# Patient Record
Sex: Male | Born: 1970 | Race: White | Hispanic: No | Marital: Married | Smoking: Never smoker
Health system: Southern US, Community
[De-identification: ages and names within clinical notes are randomized; demographics above are authoritative.]

## PROBLEM LIST (undated history)

## (undated) DIAGNOSIS — I2584 Coronary atherosclerosis due to calcified coronary lesion: Secondary | ICD-10-CM

## (undated) DIAGNOSIS — I2510 Atherosclerotic heart disease of native coronary artery without angina pectoris: Secondary | ICD-10-CM

## (undated) DIAGNOSIS — R911 Solitary pulmonary nodule: Secondary | ICD-10-CM

## (undated) DIAGNOSIS — I10 Essential (primary) hypertension: Secondary | ICD-10-CM

## (undated) DIAGNOSIS — E785 Hyperlipidemia, unspecified: Secondary | ICD-10-CM

## (undated) HISTORY — DX: Essential (primary) hypertension: I10

## (undated) HISTORY — DX: Atherosclerotic heart disease of native coronary artery without angina pectoris: I25.10

## (undated) HISTORY — DX: Solitary pulmonary nodule: R91.1

## (undated) HISTORY — DX: Hyperlipidemia, unspecified: E78.5

## (undated) HISTORY — DX: Coronary atherosclerosis due to calcified coronary lesion: I25.84

---

## 2003-11-13 HISTORY — PX: COLONOSCOPY: SHX174

## 2007-11-02 ENCOUNTER — Emergency Department (HOSPITAL_COMMUNITY): Admission: EM | Admit: 2007-11-02 | Discharge: 2007-11-02 | Payer: Self-pay | Admitting: Emergency Medicine

## 2017-12-13 ENCOUNTER — Encounter (HOSPITAL_BASED_OUTPATIENT_CLINIC_OR_DEPARTMENT_OTHER): Payer: Self-pay | Admitting: *Deleted

## 2017-12-13 ENCOUNTER — Emergency Department (HOSPITAL_BASED_OUTPATIENT_CLINIC_OR_DEPARTMENT_OTHER): Payer: 59

## 2017-12-13 ENCOUNTER — Emergency Department (HOSPITAL_BASED_OUTPATIENT_CLINIC_OR_DEPARTMENT_OTHER)
Admission: EM | Admit: 2017-12-13 | Discharge: 2017-12-13 | Disposition: A | Discharge status: Home / Self Care | Payer: 59 | Attending: Emergency Medicine | Admitting: Emergency Medicine

## 2017-12-13 DIAGNOSIS — R072 Precordial pain: Secondary | ICD-10-CM | POA: Diagnosis not present

## 2017-12-13 DIAGNOSIS — R079 Chest pain, unspecified: Secondary | ICD-10-CM | POA: Diagnosis present

## 2017-12-13 DIAGNOSIS — I10 Essential (primary) hypertension: Secondary | ICD-10-CM

## 2017-12-13 LAB — CBC WITH DIFFERENTIAL/PLATELET
Basophils Absolute: 0 10*3/uL (ref 0.0–0.1)
Basophils Relative: 0 %
EOS ABS: 0.2 10*3/uL (ref 0.0–0.7)
EOS PCT: 3 %
HCT: 49.9 % (ref 39.0–52.0)
Hemoglobin: 17.3 g/dL — ABNORMAL HIGH (ref 13.0–17.0)
LYMPHS ABS: 2 10*3/uL (ref 0.7–4.0)
Lymphocytes Relative: 29 %
MCH: 28.5 pg (ref 26.0–34.0)
MCHC: 34.7 g/dL (ref 30.0–36.0)
MCV: 82.1 fL (ref 78.0–100.0)
MONO ABS: 0.7 10*3/uL (ref 0.1–1.0)
Monocytes Relative: 10 %
Neutro Abs: 3.9 10*3/uL (ref 1.7–7.7)
Neutrophils Relative %: 58 %
PLATELETS: 199 10*3/uL (ref 150–400)
RBC: 6.08 MIL/uL — AB (ref 4.22–5.81)
RDW: 12.9 % (ref 11.5–15.5)
WBC: 6.8 10*3/uL (ref 4.0–10.5)

## 2017-12-13 LAB — BASIC METABOLIC PANEL
ANION GAP: 9 (ref 5–15)
BUN: 17 mg/dL (ref 6–20)
CALCIUM: 9.4 mg/dL (ref 8.9–10.3)
CO2: 26 mmol/L (ref 22–32)
Chloride: 104 mmol/L (ref 101–111)
Creatinine, Ser: 1.16 mg/dL (ref 0.61–1.24)
GFR calc Af Amer: >60 mL/min (ref >60)
GFR calc non Af Amer: >60 mL/min (ref >60)
GLUCOSE: 112 mg/dL — AB (ref 65–99)
Potassium: 3.9 mmol/L (ref 3.5–5.1)
SODIUM: 139 mmol/L (ref 135–145)

## 2017-12-13 LAB — TROPONIN I
Troponin I: <0.03 ng/mL (ref <0.03)
Troponin I: <0.03 ng/mL (ref <0.03)

## 2017-12-13 MED ORDER — KETOROLAC TROMETHAMINE 15 MG/ML IJ SOLN
15.0000 mg | Freq: Once | INTRAMUSCULAR | Status: AC
  Administered 2017-12-13: 15 mg via INTRAVENOUS
  Filled 2017-12-13: qty 1

## 2017-12-13 MED ORDER — HYDROCHLOROTHIAZIDE 25 MG PO TABS: 25.0000 mg | ORAL_TABLET | Freq: Every day | ORAL | 0 refills | Status: DC

## 2017-12-13 MED ORDER — METHOCARBAMOL 500 MG PO TABS
1000.0000 mg | ORAL_TABLET | Freq: Once | ORAL | Status: AC
  Administered 2017-12-13: 1000 mg via ORAL
  Filled 2017-12-13: qty 2

## 2017-12-13 MED ORDER — GI COCKTAIL ~~LOC~~
ORAL | Status: AC
  Filled 2017-12-13: qty 30

## 2017-12-13 MED ORDER — OMEPRAZOLE 20 MG PO CPDR: 20.0000 mg | DELAYED_RELEASE_CAPSULE | Freq: Every day | ORAL | 0 refills | Status: DC

## 2017-12-13 MED ORDER — IOPAMIDOL (ISOVUE-370) INJECTION 76%
100.0000 mL | Freq: Once | INTRAVENOUS | Status: AC | PRN
  Administered 2017-12-13: 100 mL via INTRAVENOUS

## 2017-12-13 MED ORDER — GI COCKTAIL ~~LOC~~
30.0000 mL | Freq: Once | ORAL | Status: AC
  Administered 2017-12-13: 30 mL via ORAL

## 2017-12-13 NOTE — ED Provider Notes (Addendum)
[facility] EMERGENCY DEPARTMENT Provider Note   CSN: [id number] Arrival date & time: 12/13/17  4098     History   Chief Complaint No chief complaint on file.   HPI Kenneth Combs is a 47 y.o. male.  The history is provided by the patient.  Chest Pain   This is a new problem. The current episode started 6 to 12 hours ago. The problem occurs constantly. The problem has not changed since onset.The pain is associated with rest and eating (Ate [employer] less than an hour prior to symptoms.  Leg pain has been off and on). The pain is present in the lateral region. The pain is severe. The quality of the pain is described as dull. The pain does not radiate. Pertinent negatives include no abdominal pain, no back pain, no diaphoresis, no dizziness, no exertional chest pressure, no near-syncope, no palpitations, no shortness of breath and no sputum production. Risk factors include male gender and obesity.  Pertinent negatives for past medical history include no Marfan's syndrome and no valve disorder.  Pertinent negatives for family medical history include: no Marfan's syndrome.  Procedure history is negative for cardiac catheterization.    No past medical history on file.  There are no active problems to display for this patient.    Home Medications    Prior to Admission medications   Not on File    Family History No family history on file.  Social History Social History   Tobacco Use  . Smoking status: Not on file  Substance Use Topics  . Alcohol use: Not on file  . Drug use: Not on file     Allergies   Patient has no allergy information on record.   Review of Systems Review of Systems  Constitutional: Negative for diaphoresis.  Respiratory: Negative for sputum production and shortness of breath.   Cardiovascular: Positive for chest pain. Negative for palpitations, leg swelling and near-syncope.  Gastrointestinal: Negative for abdominal pain.    Musculoskeletal: Positive for arthralgias. Negative for back pain.  Neurological: Negative for dizziness.  All other systems reviewed and are negative.    Physical Exam Updated Vital Signs There were no vitals taken for this visit.  Physical Exam  Constitutional: He is oriented to person, place, and time. He appears well-developed and well-nourished. No distress.  HENT:  Head: Normocephalic and atraumatic.  Nose: Nose normal.  Mouth/Throat: No oropharyngeal exudate.  Eyes: Conjunctivae are normal. Pupils are equal, round, and reactive to light.  Neck: Normal range of motion. Neck supple.  Cardiovascular: Normal rate, regular rhythm, normal heart sounds and intact distal pulses.  Pulmonary/Chest: Effort normal and breath sounds normal. No stridor. No respiratory distress. He has no wheezes. He has no rales.  Abdominal: Soft. Bowel sounds are normal. He exhibits no mass. There is no tenderness. There is no rebound and no guarding.  Musculoskeletal: Normal range of motion. He exhibits no tenderness.  Neurological: He is alert and oriented to person, place, and time.  Skin: Skin is warm and dry. Capillary refill takes less than 2 seconds.  Psychiatric: He has a normal mood and affect.     ED Treatments / Results  Labs (all labs ordered are listed, but only abnormal results are displayed)  Results for orders placed or performed during the hospital encounter of 12/13/17  CBC with Differential/Platelet  Result Value Ref Range   WBC 6.8 4.0 - 10.5 K/uL   RBC 6.08 (H) 4.22 - 5.81 MIL/uL   Hemoglobin  17.3 (H) 13.0 - 17.0 g/dL   HCT 16.1 09.6 - 04.5 %   MCV 82.1 78.0 - 100.0 fL   MCH 28.5 26.0 - 34.0 pg   MCHC 34.7 30.0 - 36.0 g/dL   RDW 40.9 81.1 - 91.4 %   Platelets 199 150 - 400 K/uL   Neutrophils Relative % 58 %   Lymphocytes Relative 29 %   Monocytes Relative 10 %   Eosinophils Relative 3 %   Basophils Relative 0 %   Neutro Abs 3.9 1.7 - 7.7 K/uL   Lymphs Abs 2.0 0.7 -  4.0 K/uL   Monocytes Absolute 0.7 0.1 - 1.0 K/uL   Eosinophils Absolute 0.2 0.0 - 0.7 K/uL   Basophils Absolute 0.0 0.0 - 0.1 K/uL   WBC Morphology ATYPICAL LYMPHOCYTES   Basic metabolic panel  Result Value Ref Range   Sodium 139 135 - 145 mmol/L   Potassium 3.9 3.5 - 5.1 mmol/L   Chloride 104 101 - 111 mmol/L   CO2 26 22 - 32 mmol/L   Glucose, Bld 112 (H) 65 - 99 mg/dL   BUN 17 6 - 20 mg/dL   Creatinine, Ser 7.82 0.61 - 1.24 mg/dL   Calcium 9.4 8.9 - 95.6 mg/dL   GFR calc non Af Amer >60 >60 mL/min   GFR calc Af Amer >60 >60 mL/min   Anion gap 9 5 - 15  Troponin I  Result Value Ref Range   Troponin I <0.03 <0.03 ng/mL   Dg Chest 2 View  Result Date: 12/13/2017 CLINICAL DATA:  Left chest pain for 6 hours. Leg swelling for 1 day. Nonsmoker EXAM: CHEST  2 VIEW COMPARISON:  None. FINDINGS: The heart size and mediastinal contours are within normal limits. Both lungs are clear. The visualized skeletal structures are unremarkable. IMPRESSION: No active cardiopulmonary disease. Electronically Signed   By: Burman Nieves M.D.   On: 12/13/2017 04:07   Ct Angio Chest Pe W And/or Wo Contrast  Result Date: 12/13/2017 CLINICAL DATA:  Left chest pain tonight. Right posterior thigh pain. Nonsmoker. EXAM: CT ANGIOGRAPHY CHEST WITH CONTRAST TECHNIQUE: Multidetector CT imaging of the chest was performed using the standard protocol during bolus administration of intravenous contrast. Multiplanar CT image reconstructions and MIPs were obtained to evaluate the vascular anatomy. CONTRAST:  ISOVUE-370 IOPAMIDOL (ISOVUE-370) INJECTION 76% COMPARISON:  Chest radiograph 12/13/2017 FINDINGS: Cardiovascular: Good opacification of the central and segmental pulmonary arteries. No focal filling defects. No evidence of significant pulmonary embolus. Normal caliber thoracic aorta. Normal heart size. No pericardial effusion. Few scattered coronary artery calcifications. Mediastinum/Nodes: No enlarged mediastinal,  hilar, or axillary lymph nodes. Thyroid gland, trachea, and esophagus demonstrate no significant findings. Lungs/Pleura: Mild dependent changes in the lung bases. No consolidation or airspace disease. 7 mm nodule at the superior segment of the right lower lobe. No pleural effusions. No pneumothorax. Airways are patent. Upper Abdomen: 8.6 cm cyst in the upper pole right kidney. No acute process demonstrated in the upper abdomen. Musculoskeletal: No chest wall abnormality. No acute or significant osseous findings. Review of the MIP images confirms the above findings. IMPRESSION: 1. No evidence of significant pulmonary embolus. 2. Few scattered coronary artery calcifications. 3. 7 mm nodule in the superior segment right lower lobe. Non-contrast chest CT at 6-12 months is recommended. If the nodule is stable at time of repeat CT, then future CT at 18-24 months (from today's scan) is considered optional for low-risk patients, but is recommended for high-risk patients. This recommendation follows  the consensus statement: Guidelines for Management of Incidental Pulmonary Nodules Detected on CT Images: From the Fleischner Society 2017; Radiology 2017; 284:228-243. 4. Benign-appearing cyst in the upper pole right kidney. Electronically Signed   By: Burman NievesWilliam  Stevens M.D.   On: 12/13/2017 04:49    EKG  EKG Interpretation  Date/Time:  Friday December 13 2017 03:34:02 EST Ventricular Rate:  93 PR Interval:    QRS Duration: 105 QT Interval:  343 QTC Calculation: 427 R Axis:   60 Text Interpretation:  Sinus rhythm Confirmed by Nicanor AlconPalumbo, Christerpher Clos ([id number]) on 12/13/2017 3:40:52 AM        Procedures Procedures (including critical care time)  Medications Ordered in ED  Medications  ketorolac (TORADOL) 15 MG/ML injection 15 mg (15 mg Intravenous Given 12/13/17 0407)  gi cocktail (Maalox,Lidocaine,Donnatal) (30 mLs Oral Given 12/13/17 0423)  iopamidol (ISOVUE-370) 76 % injection 100 mL (100 mLs Intravenous Contrast Given  12/13/17 0431)  methocarbamol (ROBAXIN) tablet 1,000 mg (1,000 mg Oral Given 12/13/17 0605)   Second troponin did not cross over into EMR but EDP reviewed paper copy from labs and it was 0.0.  Patient has ruled out for MI in the ED  HEART score 1, very low risk for MACE.  Ruled out in the ED  Final Clinical Impressions(s) / ED Diagnoses   Follow up with cardiology for outpatient stress test and PMD, informed of need for follow up chest CT in 6-12 months to assess stability of pulmonary nodule seen on CT, this was printed on your discharge paperwork.  Symptons are consistent with GERD.  Will start PMD and advise gerd friendly diet.  Will also start HCTZ.  Return for weakness, numbness, changes in vision or speech,  fevers > 100.4 unrelieved by medication, shortness of breath, intractable vomiting, or diarrhea, abdominal pain, Inability to tolerate liquids or food, cough, altered mental status or any concerns. No signs of systemic illness or infection. The patient is nontoxic-appearing on exam and vital signs are within normal limits.    I have reviewed the triage vital signs and the nursing notes. Pertinent labs &imaging results that were available during my care of the patient were reviewed by me and considered in my medical decision making (see chart for details).  After history, exam, and medical workup I feel the patient has been appropriately medically screened and is safe for discharge home. Pertinent diagnoses were discussed with the patient. Patient was given return precautions.     Rickiya Picariello, MD 12/13/17 [id number]    Cy BlamerPalumbo, Trashaun Streight, MD 12/13/17 [id number]    Cy BlamerPalumbo, Lavel Rieman, MD 12/13/17 [id number]

## 2017-12-13 NOTE — ED Triage Notes (Signed)
C/o left lower chest pain onset 2100 last pm  Denies n/v no sob,  Also rt thigh pain

## 2017-12-13 NOTE — Discharge Instructions (Addendum)
7 mm nodule in the superior segment right lower lobe. Non-contrast chest CT at 6-12 months is recommended. If the nodule is stable at time of repeat CT, then future CT at 18-24 months (from today's scan) is considered optional for low-risk patients, but is recommended for high-risk patients. This recommendation follows the consensus statement

## 2017-12-17 DIAGNOSIS — R911 Solitary pulmonary nodule: Secondary | ICD-10-CM | POA: Insufficient documentation

## 2017-12-17 DIAGNOSIS — I10 Essential (primary) hypertension: Secondary | ICD-10-CM | POA: Insufficient documentation

## 2018-01-24 ENCOUNTER — Encounter: Payer: Self-pay | Admitting: Cardiology

## 2018-02-04 ENCOUNTER — Ambulatory Visit: Payer: 59 | Admitting: Cardiology

## 2018-02-04 ENCOUNTER — Encounter: Payer: Self-pay | Admitting: Cardiology

## 2018-02-04 VITALS — BP 138/90 | HR 78 | Ht 75.0 in | Wt 287.8 lb

## 2018-02-04 DIAGNOSIS — I10 Essential (primary) hypertension: Secondary | ICD-10-CM

## 2018-02-04 DIAGNOSIS — I2510 Atherosclerotic heart disease of native coronary artery without angina pectoris: Secondary | ICD-10-CM | POA: Diagnosis not present

## 2018-02-04 DIAGNOSIS — I2584 Coronary atherosclerosis due to calcified coronary lesion: Secondary | ICD-10-CM

## 2018-02-04 DIAGNOSIS — R079 Chest pain, unspecified: Secondary | ICD-10-CM | POA: Insufficient documentation

## 2018-02-04 HISTORY — DX: Atherosclerotic heart disease of native coronary artery without angina pectoris: I25.10

## 2018-02-04 NOTE — Progress Notes (Signed)
Cardiology Office Note    Date:  02/04/2018   ID:  Kenneth GulaEric Tufte, DOB 05/23/1971, MRN [id number]  PCP:  Patient, No Pcp Per  Cardiologist:  Armanda Magicraci Turner, MD   Chief Complaint  Patient presents with  . New Patient (Initial Visit)    chest pain    History of Present Illness:  Kenneth Combs Akter is a 47 y.o. male who is being seen today for the evaluation of chest pain  at the request of Dr. Nicanor AlconPalumbo.  This is a 47yo male with a history of HTN who recently was seen in the ER a month ago for chest pain.  He says that he was trying to go to sleep and started having pain in his left chest which he initially thought was gas.  He then started getting leg pain in the right leg from the knee back to the thigh and he became concerned and went to the ER.  He denied any SOB, nausea or diaphoresis with the discomfort.  In the ER he was given an antacid and workup was normal including trop x 2.  He says that the leg and CP were gone by the time the ER MD saw him and he has not had any further episodes.  He denies any DOE, PND, orthopnea, LE edema, dizziness, palpitations or syncope.  He has never smoked.  He has no family history of heart disease. Chest CT angio showed minimal coronary artery calcifications.      Past Medical History:  Diagnosis Date  . Coronary artery calcification 02/04/2018  . Hypertension     No past surgical history on file.  Current Medications: Current Meds  Medication Sig  . hydrochlorothiazide (HYDRODIURIL) 25 MG tablet Take 1 tablet by mouth daily.  . meloxicam (MOBIC) 15 MG tablet Take 1 tablet by mouth daily.    Allergies:   Patient has no known allergies.   Social History   Socioeconomic History  . Marital status: Married    Spouse name: Not on file  . Number of children: Not on file  . Years of education: Not on file  . Highest education level: Not on file  Occupational History  . Not on file  Social Needs  . Financial resource strain: Not on file  . Food  insecurity:    Worry: Not on file    Inability: Not on file  . Transportation needs:    Medical: Not on file    Non-medical: Not on file  Tobacco Use  . Smoking status: Never Smoker  . Smokeless tobacco: Never Used  Substance and Sexual Activity  . Alcohol use: No    Frequency: Never  . Drug use: No  . Sexual activity: Not on file  Lifestyle  . Physical activity:    Days per week: Not on file    Minutes per session: Not on file  . Stress: Not on file  Relationships  . Social connections:    Talks on phone: Not on file    Gets together: Not on file    Attends religious service: Not on file    Active member of club or organization: Not on file    Attends meetings of clubs or organizations: Not on file    Relationship status: Not on file  Other Topics Concern  . Not on file  Social History Narrative  . Not on file     Family History:  The patient's  family history includes Cancer in his father and mother.  ROS:   Please see the history of present illness.    ROS All other systems reviewed and are negative.  No flowsheet data found.     PHYSICAL EXAM:   VS:  BP 138/90   Pulse 78   Ht 6\' 3"  (1.905 m)   Wt 287 lb 12.8 oz (130.5 kg)   SpO2 98%   BMI 35.97 kg/m    GEN: Well nourished, well developed, in no acute distress  HEENT: normal  Neck: no JVD, carotid bruits, or masses Cardiac: RRR; no murmurs, rubs, or gallops,no edema.  Intact distal pulses bilaterally.  Respiratory:  clear to auscultation bilaterally, normal work of breathing GI: soft, nontender, nondistended, + BS MS: no deformity or atrophy  Skin: warm and dry, no rash Neuro:  Alert and Oriented x 3, Strength and sensation are intact Psych: euthymic mood, full affect  Wt Readings from Last 3 Encounters:  02/04/18 287 lb 12.8 oz (130.5 kg)  12/13/17 285 lb (129.3 kg)      Studies/Labs Reviewed:   EKG:  EKG is not ordered today.   Recent Labs: 12/13/2017: BUN 17; Creatinine, Ser 1.16;  Hemoglobin 17.3; Platelets 199; Potassium 3.9; Sodium 139   Lipid Panel No results found for: CHOL, TRIG, HDL, CHOLHDL, VLDL, LDLCALC, LDLDIRECT  Additional studies/ records that were reviewed today include:  Chest CT angio and ER notes    ASSESSMENT:    1. Chest pain, unspecified type   2. Essential hypertension   3. Coronary artery calcification      PLAN:  In order of problems listed above:  1. Chest pain - his sx are atypical but he does have minimal coronary artery calcifications on chest CT.  His EKG is nonischemic, he does not smoke and he has no family history of CAD.  His only CRF is HTN.  I have recommended an ETT to rule out ischemia.    2.  HTN - Bp is well controlled one exam today.  He will continue on HCTZ 25mg  daily.   3.  Coronary artery calcifications - minimal on recent chest CT - I will get a coronary calcium score for risk stratification.    Medication Adjustments/Labs and Tests Ordered: Current medicines are reviewed at length with the patient today.  Concerns regarding medicines are outlined above.  Medication changes, Labs and Tests ordered today are listed in the Patient Instructions below.  There are no Patient Instructions on file for this visit.   Signed, Armanda Magic, MD  02/04/2018 1:55 PM    [facility] [address] [place], [place], [place]  [address] Phone: [phone]; Fax: [phone]

## 2018-02-04 NOTE — Patient Instructions (Addendum)
Medication Instructions:  Your physician recommends that you continue on your current medications as directed. Please refer to the Current Medication list given to you today.  If you need a refill on your cardiac medications, please contact your pharmacy first.  Labwork: None ordered   Testing/Procedures: Your physician has requested that you have an exercise tolerance test. For further information please visit [email or web address] Please also follow instruction sheet, as given.  Your physician has requested that you have cardiac CT. Cardiac computed tomography (CT) is a painless test that uses an x-ray machine to take clear, detailed pictures of your heart. For further information please visit [email or web address] Please follow instruction sheet as given.   Follow-Up: Your physician wants you to follow-up as needed with Dr. Mayford Knifeurner.   Any Other Special Instructions Will Be Listed Below (If Applicable).   [facility] you [facility] choosing [facility]    Lyda PeroneRena Clancey Welton, RN  [phone]  If you need a refill on your cardiac medications before your next appointment, please call your pharmacy.

## 2018-03-14 ENCOUNTER — Encounter (INDEPENDENT_AMBULATORY_CARE_PROVIDER_SITE_OTHER): Payer: Self-pay

## 2018-03-14 ENCOUNTER — Ambulatory Visit (INDEPENDENT_AMBULATORY_CARE_PROVIDER_SITE_OTHER)
Admission: RE | Admit: 2018-03-14 | Discharge: 2018-03-14 | Disposition: A | Discharge status: Home / Self Care | Payer: Self-pay | Source: Ambulatory Visit | Attending: Cardiology | Admitting: Cardiology

## 2018-03-14 ENCOUNTER — Ambulatory Visit (INDEPENDENT_AMBULATORY_CARE_PROVIDER_SITE_OTHER): Payer: 59

## 2018-03-14 DIAGNOSIS — I2510 Atherosclerotic heart disease of native coronary artery without angina pectoris: Secondary | ICD-10-CM | POA: Diagnosis not present

## 2018-03-14 DIAGNOSIS — I2584 Coronary atherosclerosis due to calcified coronary lesion: Secondary | ICD-10-CM

## 2018-03-14 DIAGNOSIS — R079 Chest pain, unspecified: Secondary | ICD-10-CM | POA: Diagnosis not present

## 2018-03-14 LAB — EXERCISE TOLERANCE TEST
CHL CUP MPHR: 174 {beats}/min
CHL RATE OF PERCEIVED EXERTION: 19
CSEPED: 9 min
Estimated workload: 11.7 METS
Exercise duration (sec): 59 s
Peak HR: 190 {beats}/min
Percent HR: 109 %
Rest HR: 76 {beats}/min

## 2018-03-19 ENCOUNTER — Telehealth: Payer: Self-pay

## 2018-03-19 DIAGNOSIS — E785 Hyperlipidemia, unspecified: Secondary | ICD-10-CM

## 2018-03-19 DIAGNOSIS — R931 Abnormal findings on diagnostic imaging of heart and coronary circulation: Secondary | ICD-10-CM

## 2018-03-19 MED ORDER — ASPIRIN EC 81 MG PO TBEC: 81.0000 mg | DELAYED_RELEASE_TABLET | Freq: Every day | ORAL | 3 refills | Status: AC

## 2018-03-19 MED ORDER — ATORVASTATIN CALCIUM 20 MG PO TABS: 20.0000 mg | ORAL_TABLET | Freq: Every day | ORAL | 11 refills | Status: DC

## 2018-03-19 NOTE — Telephone Encounter (Signed)
Patient instructed to start Lipitor 20 mg daily and repeat labs on 05/19/18. He verbalized understanding and thankful for the call

## 2018-03-19 NOTE — Telephone Encounter (Signed)
Patient had a lipid panel drawn at his primary MD at [facility] on 01/2018, labs are visible under care everywhere. Total Cholesterol 174 TG 93 HDL 35 LDL 120  ALT 30  Notes recorded by Phineas Semen, RN on 03/19/2018 at 3:34 PM EDT Patient made aware of cardiac scoring results. He was informed of Dr. Norris Cross recommendation to start ASA 81 mg daily and lipid panel. Patient in agreement with treatment plan and thankful for the call. Patient schedule for NMR lipid panel on 03/24/18.   Notes recorded by Quintella Reichert, MD on 03/17/2018 at 5:32 PM EDT Increased calcium score at 158 which is 96% for age and sex matched controls - needs aggressive risk factor modification. ETT with no ischemia. Please get an FLP with NMR panel and HbA1C. Followup with me in 1 year. Start ASA  daily

## 2018-03-19 NOTE — Addendum Note (Signed)
Addended by: Phineas Semen on: 03/19/2018 04:23 PM   Modules accepted: Orders

## 2018-03-19 NOTE — Telephone Encounter (Signed)
Start Lipitor  daily and repeat FLP and ALT in 6 weeks

## 2018-03-24 ENCOUNTER — Other Ambulatory Visit: Payer: 59 | Admitting: *Deleted

## 2018-03-24 DIAGNOSIS — R931 Abnormal findings on diagnostic imaging of heart and coronary circulation: Secondary | ICD-10-CM

## 2018-03-25 LAB — NMR, LIPOPROFILE
CHOLESTEROL, TOTAL: 177 mg/dL (ref 100–199)
HDL PARTICLE NUMBER: 29.9 umol/L — AB (ref >=30.5)
HDL-C: 43 mg/dL (ref >39)
LDL Particle Number: 1523 nmol/L — ABNORMAL HIGH (ref <1000)
LDL Size: 20.7 nm (ref >20.5)
LDL-C: 108 mg/dL — ABNORMAL HIGH (ref 0–99)
LP-IR Score: 83 — ABNORMAL HIGH (ref <=45)
SMALL LDL PARTICLE NUMBER: 855 nmol/L — AB (ref <=527)
Triglycerides: 132 mg/dL (ref 0–149)

## 2018-03-25 LAB — LIPOPROTEIN A (LPA): Lipoprotein (a): 4 nmol/L (ref <75)

## 2018-03-25 LAB — APOLIPOPROTEIN B: APOLIPOPROTEIN B: 94 mg/dL — AB (ref <90)

## 2018-04-01 ENCOUNTER — Telehealth: Payer: Self-pay

## 2018-04-01 DIAGNOSIS — E785 Hyperlipidemia, unspecified: Secondary | ICD-10-CM

## 2018-04-01 MED ORDER — ATORVASTATIN CALCIUM 80 MG PO TABS: 80.0000 mg | ORAL_TABLET | Freq: Every day | ORAL | 3 refills | Status: DC

## 2018-04-01 NOTE — Telephone Encounter (Signed)
Notes recorded by Phineas Semen, RN on 04/01/2018 at 2:28 PM EDT Patient made aware of lipid results and Dr. Norris Cross recommendation to increase Lipitor to 80 mg daily and repeat FLP and ALT in 8 weeks. Patient stated he would call back to schedule lab appt. Patient verbalized understanding and thankful for the call ------  Notes recorded by Quintella Reichert, MD on 04/01/2018 at 7:52 AM EDT Increase atorvastatin to  daily and repeat FLP and ALT in 8 weeks

## 2018-04-02 NOTE — Telephone Encounter (Signed)
Patient scheduled for repeat FLP and ALT on 05/19/18.

## 2018-05-19 ENCOUNTER — Other Ambulatory Visit: Payer: 59

## 2018-11-18 IMAGING — CT CT HEART SCORING
2 series · 14 of 20 positions shown, 16 images · non-contrast
Comparison: 12/13/2017

CLINICAL DATA: Risk stratification

EXAM:
Coronary Calcium Score
TECHNIQUE: The patient was scanned on a Siemens Force scanner. Axial
non-contrast 3 mm slices were carried out through the heart. The
data set was analyzed on a dedicated work station and scored using
the Agatson method.

[Series 3: casc 3.0 i36f 2 bestdiast 66 % · axial · 0.32mm/px · z∈[-200,-113]mm · 6 of 41 slices shown, 8 images]
[im 6/41  vessel]
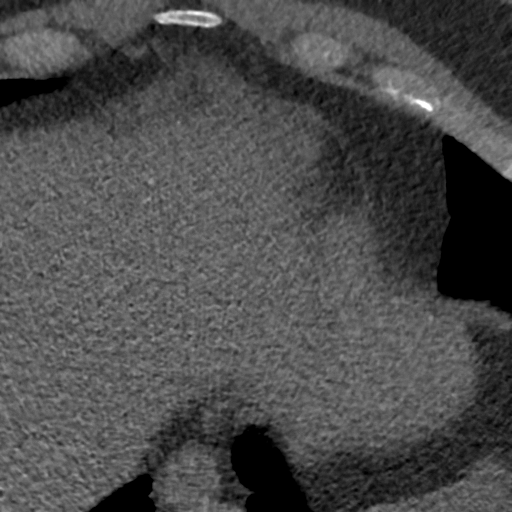
[im 6/41  lung]
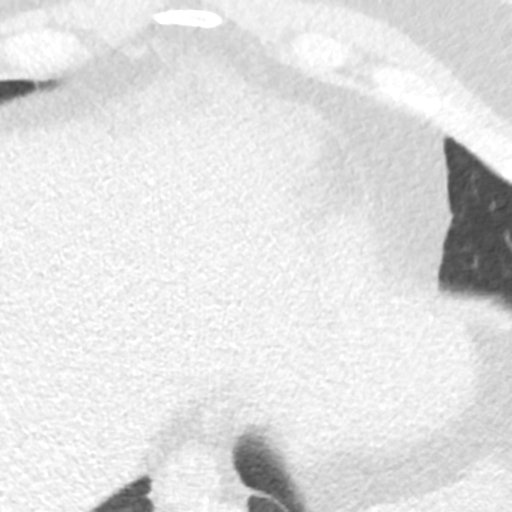
[im 12/41  vessel]
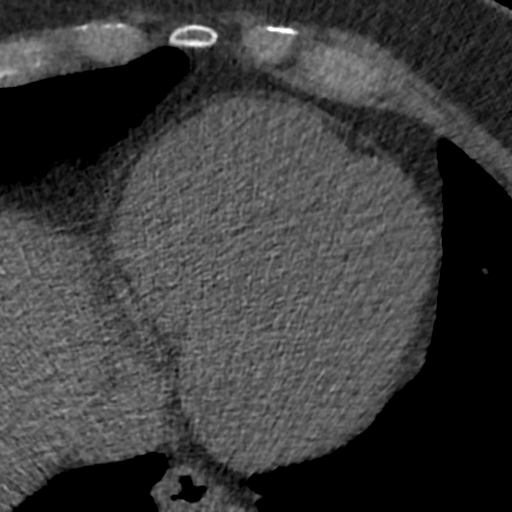
[im 18/41  vessel]
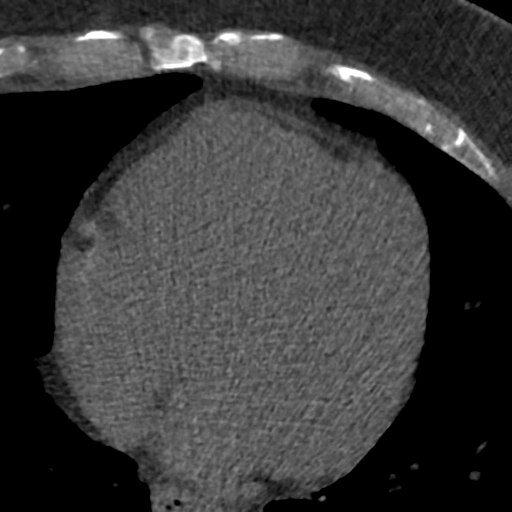
[im 23/41  vessel]
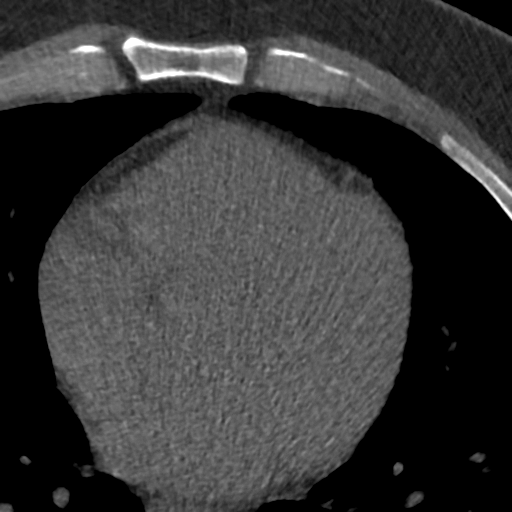
[im 29/41  vessel]
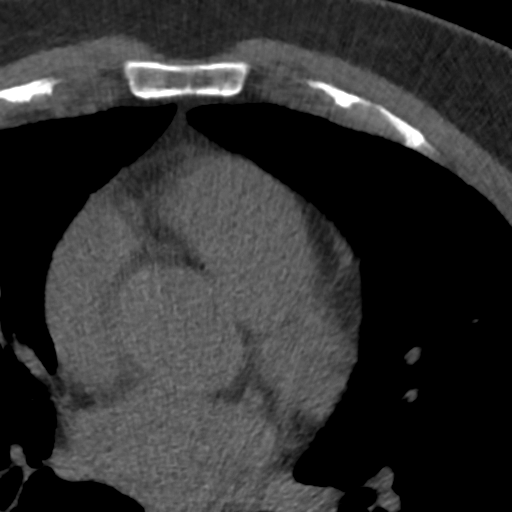
[im 29/41  lung]
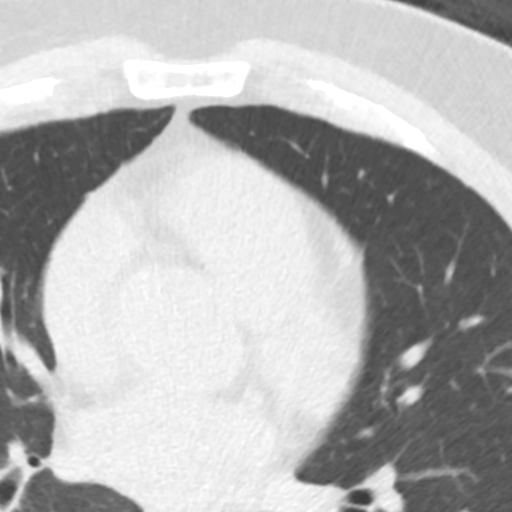
[im 35/41  vessel]
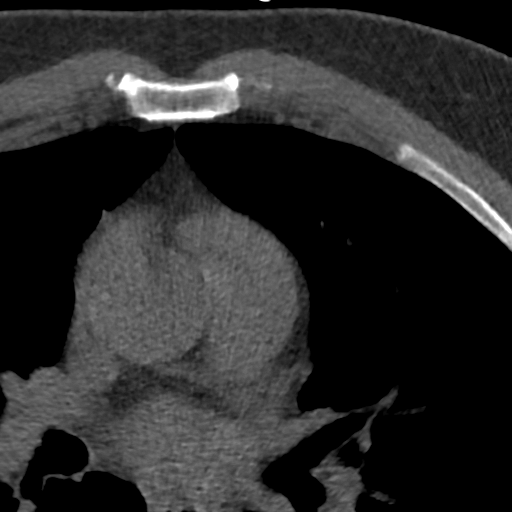

[Series 9: lung st2 66 % · axial · 0.58mm/px · z∈[-196,-44]mm · 8 of 63 slices shown]
[im 6/63  lung]
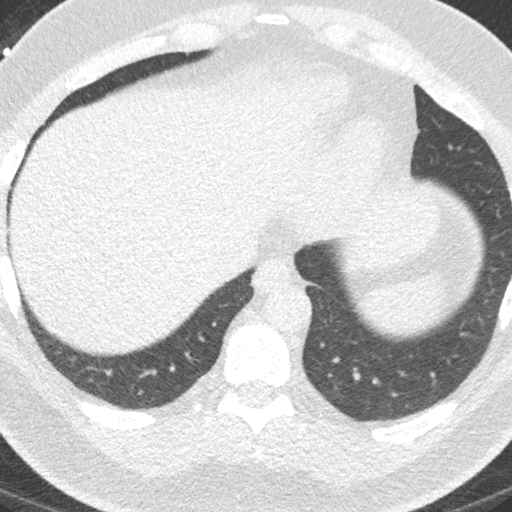
[im 12/63  lung]
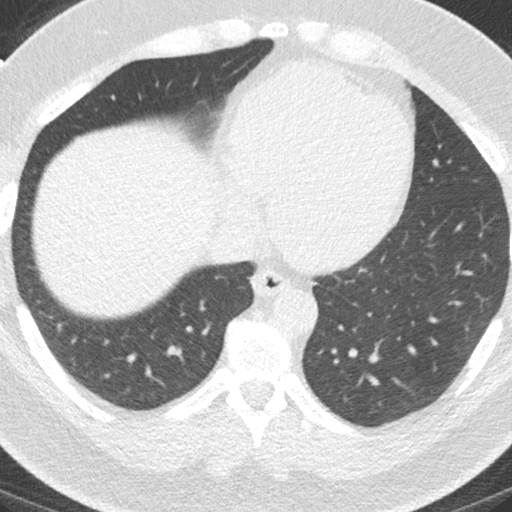
[im 23/63  lung]
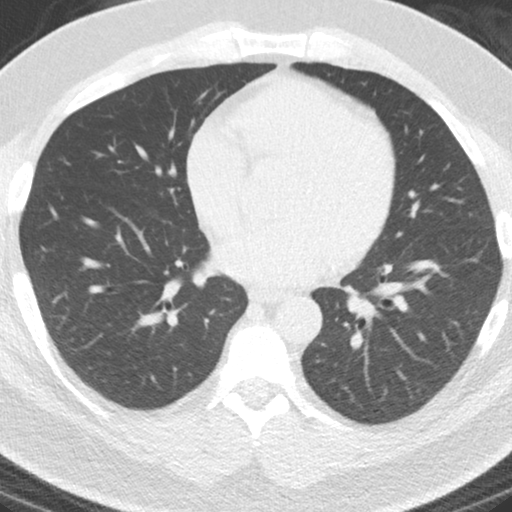
[im 29/63  lung]
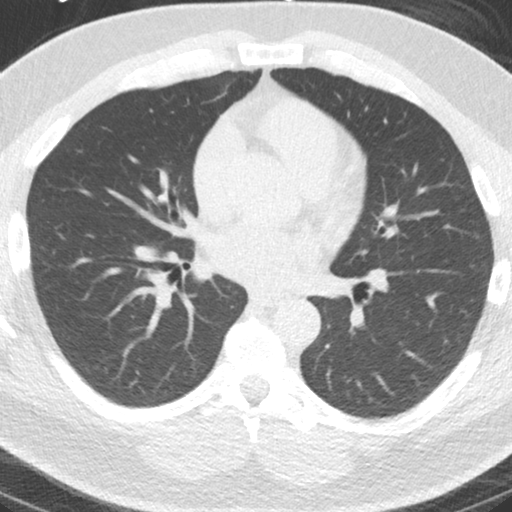
[im 34/63  lung]
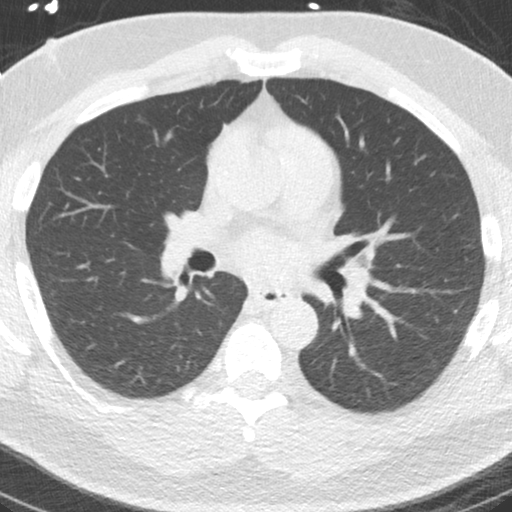
[im 40/63  lung]
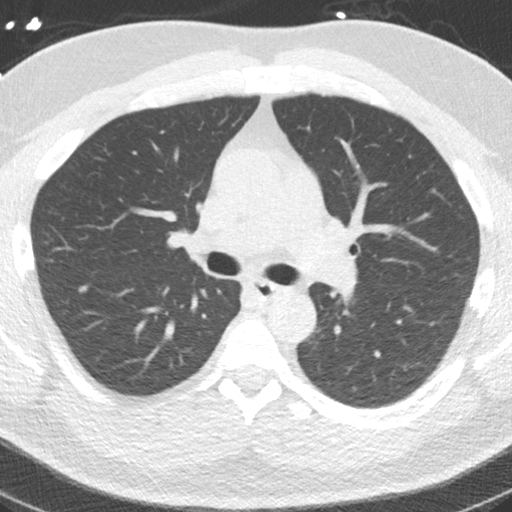
[im 51/63  lung]
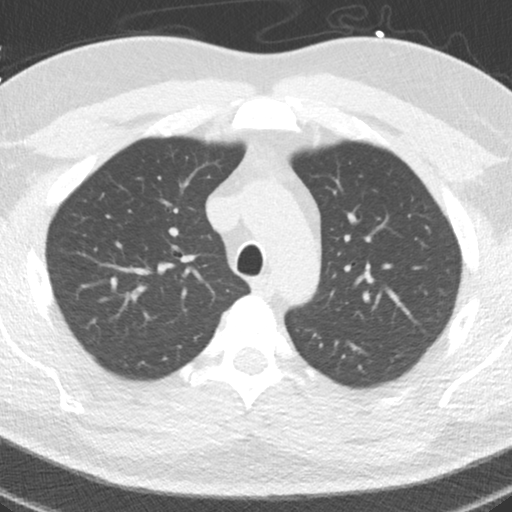
[im 57/63  lung]
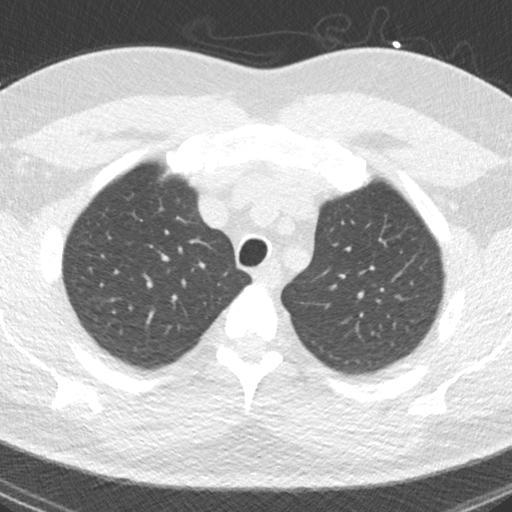

[14 of 20 positions shown; findings below may reference images not displayed]

FINDINGS: Non-cardiac: See separate report from [REDACTED].

Ascending Aorta: Normal size, mild calcifications in the aortic
root.

Pericardium: Normal.

Coronary arteries: Normal origin.
IMPRESSION: Coronary calcium score of 158. This was 96 percentile for age and
sex matched control. Calcifications present diffusely in all three
coronary arteries.

EXAM:
OVER-READ INTERPRETATION  CT CHEST

The following report is an over-read performed by radiologist Dr.
Ingunn Harpa Ronlor [REDACTED] on 03/14/2018. This over-read
does not include interpretation of cardiac or coronary anatomy or
pathology. The coronary calcium score interpretation by the
cardiologist is attached.
FINDINGS: Vascular: Heart is normal size.  Aorta is normal caliber

Mediastinum/Nodes: No mediastinal, hilar, or axillary adenopathy.

Lungs/Pleura: Visualized lungs clear.  No effusions.

Upper Abdomen: Imaging into the upper abdomen shows no acute
findings.

Musculoskeletal: Chest wall soft tissues are unremarkable. No acute
bony abnormality.
IMPRESSION: No acute or significant extracardiac abnormality.

## 2019-04-04 ENCOUNTER — Other Ambulatory Visit: Payer: Self-pay | Admitting: Cardiology

## 2019-07-06 ENCOUNTER — Other Ambulatory Visit: Payer: Self-pay | Admitting: Cardiology

## 2019-07-21 ENCOUNTER — Other Ambulatory Visit: Payer: Self-pay | Admitting: Cardiology

## 2019-12-07 ENCOUNTER — Emergency Department (HOSPITAL_BASED_OUTPATIENT_CLINIC_OR_DEPARTMENT_OTHER): Payer: 59

## 2019-12-07 ENCOUNTER — Emergency Department (HOSPITAL_BASED_OUTPATIENT_CLINIC_OR_DEPARTMENT_OTHER)
Admission: EM | Admit: 2019-12-07 | Discharge: 2019-12-07 | Disposition: A | Discharge status: Home / Self Care | Payer: 59 | Attending: Emergency Medicine | Admitting: Emergency Medicine

## 2019-12-07 ENCOUNTER — Encounter (HOSPITAL_BASED_OUTPATIENT_CLINIC_OR_DEPARTMENT_OTHER): Payer: Self-pay | Admitting: *Deleted

## 2019-12-07 ENCOUNTER — Other Ambulatory Visit: Payer: Self-pay

## 2019-12-07 DIAGNOSIS — I10 Essential (primary) hypertension: Secondary | ICD-10-CM | POA: Insufficient documentation

## 2019-12-07 DIAGNOSIS — Z79899 Other long term (current) drug therapy: Secondary | ICD-10-CM | POA: Diagnosis not present

## 2019-12-07 DIAGNOSIS — U071 COVID-19: Secondary | ICD-10-CM | POA: Diagnosis not present

## 2019-12-07 DIAGNOSIS — R0602 Shortness of breath: Secondary | ICD-10-CM | POA: Diagnosis present

## 2019-12-07 DIAGNOSIS — Z7982 Long term (current) use of aspirin: Secondary | ICD-10-CM | POA: Insufficient documentation

## 2019-12-07 MED ORDER — BENZONATATE 100 MG PO CAPS: 100.0000 mg | ORAL_CAPSULE | Freq: Three times a day (TID) | ORAL | 0 refills | Status: DC | PRN

## 2019-12-07 MED ORDER — DM-GUAIFENESIN ER 30-600 MG PO TB12: 1.0000 | ORAL_TABLET | Freq: Two times a day (BID) | ORAL | 0 refills | Status: DC | PRN

## 2019-12-07 MED FILL — BENZONATATE 100 MG CAPS: 100 | 7 days supply | Qty: 21 | Fill #0

## 2019-12-07 MED FILL — MUCINEX DM ER 600-30 MG TAB: 30-600 | 10 days supply | Qty: 20 | Fill #0

## 2019-12-07 NOTE — ED Triage Notes (Signed)
Covid positive a week ago. Cough and increased congestion. Chest feels tight. SOB.

## 2019-12-07 NOTE — ED Provider Notes (Signed)
[facility] EMERGENCY DEPARTMENT Provider Note   CSN: [id number] Arrival date & time: 12/07/19  1128     History Chief Complaint  Patient presents with  . Shortness of Breath    + covid one week ago    Kenneth Combs is a 49 y.o. male.  The history is provided by the patient and medical records. No language interpreter was used.   Kenneth Combs is a 49 y.o. male who presents to the Emergency Department complaining of shortness of breath. He presents the emergency department complaining of chest tightness and shortness of breath. He tested positive for COVID-19 one week ago. He initially was experiencing chest congestion and chills. Yesterday he developed increased cough and shortness of breath. Cough is nonproductive. He denies any leg swelling or pain. No black or bloody stools. He did have one day of diarrhea, now resolved. He has a history of hypertension.    Past Medical History:  Diagnosis Date  . Coronary artery calcification 02/04/2018  . Hypertension     Patient Active Problem List   Diagnosis Date Noted  . Chest pain 02/04/2018  . Coronary artery calcification 02/04/2018  . Essential hypertension 12/17/2017  . Pulmonary nodule 12/17/2017    History reviewed. No pertinent surgical history.     Family History  Problem Relation Age of Onset  . Cancer Mother   . Cancer Father     Social History   Tobacco Use  . Smoking status: Never Smoker  . Smokeless tobacco: Never Used  Substance Use Topics  . Alcohol use: No  . Drug use: No    Home Medications Prior to Admission medications   Medication Sig Start Date End Date Taking? Authorizing Provider  aspirin EC 81 MG tablet Take 1 tablet (81 mg total) by mouth daily. 03/19/18  Yes Turner, Cornelious Bryant, MD  atorvastatin (LIPITOR) 80 MG tablet Take 1 tablet (80 mg total) by mouth daily at 6 PM. Please make overdue appt with Dr. Mayford Knife before anymore refills. 2nd attempt 07/23/19  Yes Turner, Cornelious Bryant, MD    hydrochlorothiazide (HYDRODIURIL) 25 MG tablet Take 1 tablet by mouth daily. 01/10/18  Yes [provider]  benzonatate (TESSALON) 100 MG capsule Take 1 capsule (100 mg total) by mouth 3 (three) times daily as needed for cough. 12/07/19   Tilden Fossa, MD  dextromethorphan-guaiFENesin [facility] DM) 30-600 MG 12hr tablet Take 1 tablet by mouth 2 (two) times daily as needed for cough. 12/07/19   Tilden Fossa, MD  meloxicam (MOBIC) 15 MG tablet Take 1 tablet by mouth daily. 08/01/17 01/10/22  [provider]  omeprazole (PRILOSEC) 20 MG capsule Take 1 capsule (20 mg total) by mouth daily. Patient not taking: Reported on 02/04/2018 12/13/17   Nicanor Alcon, April, MD    Allergies    Patient has no known allergies.  Review of Systems   Review of Systems  All other systems reviewed and are negative.   Physical Exam Updated Vital Signs BP (!) 119/95   Pulse 97   Temp 98.6 F (37 C) (Oral)   Resp 20   Ht 6\' 3"  (1.905 m)   Wt 131.5 kg   SpO2 100%   BMI 36.25 kg/m   Physical Exam Vitals and nursing note reviewed.  Constitutional:      Appearance: He is well-developed.  HENT:     Head: Normocephalic and atraumatic.  Cardiovascular:     Rate and Rhythm: Normal rate and regular rhythm.     Heart sounds: No  murmur.  Pulmonary:     Effort: Pulmonary effort is normal. No respiratory distress.     Breath sounds: Normal breath sounds.  Abdominal:     Palpations: Abdomen is soft.     Tenderness: There is no abdominal tenderness. There is no guarding or rebound.  Musculoskeletal:        General: No swelling or tenderness.  Skin:    General: Skin is warm and dry.  Neurological:     Mental Status: He is alert and oriented to person, place, and time.  Psychiatric:        Mood and Affect: Mood normal.        Behavior: Behavior normal.     ED Results / Procedures / Treatments   Labs (all labs ordered are listed, but only abnormal results are displayed) Labs Reviewed - No  data to display  EKG EKG Interpretation  Date/Time:  Monday December 07 2019 11:43:57 EST Ventricular Rate:  100 PR Interval:    QRS Duration: 104 QT Interval:  348 QTC Calculation: 449 R Axis:   48 Text Interpretation: Sinus tachycardia Confirmed by Quintella Reichert [phone]) on 12/07/2019 11:53:53 AM   Radiology DG Chest Portable 1 View  Result Date: 12/07/2019 CLINICAL DATA:  Cough, shortness of breath, COVID positive recently EXAM: PORTABLE CHEST 1 VIEW COMPARISON:  2019 FINDINGS: The heart size and mediastinal contours are within normal limits. Both lungs are clear. No pleural effusion or pneumothorax. The visualized skeletal structures are unremarkable. IMPRESSION: No acute process in the chest. Electronically Signed   By: Macy Mis M.D.   On: 12/07/2019 12:16    Procedures Procedures (including critical care time)  Medications Ordered in ED Medications - No data to display  ED Course  I have reviewed the triage vital signs and the nursing notes.  Pertinent labs & imaging results that were available during my care of the patient were reviewed by me and considered in my medical decision making (see chart for details).    MDM Rules/Calculators/A&P                     Patient with a diagnosis of COVID-19 infection one week ago here for evaluation of shortness of breath. He is non-toxic appearing on evaluation with no respiratory distress. He has no hypoxia or increased work of breathing. Presentation is not consistent with symptomatic anemia, acute renal failure, CHF, PE. Discussed with patient home care for COVID-19 infection. Discussed outpatient follow-up and return precautions.  Final Clinical Impression(s) / ED Diagnoses Final diagnoses:  XIPJA-25 virus infection  Shortness of breath    Rx / DC Orders ED Discharge Orders         Ordered    benzonatate (TESSALON) 100 MG capsule  3 times daily PRN     12/07/19 1241    dextromethorphan-guaiFENesin (MUCINEX DM)  30-600 MG 12hr tablet  2 times daily PRN     12/07/19 1241           Quintella Reichert, MD 12/07/19 1245

## 2019-12-07 NOTE — ED Notes (Signed)
Ambulated from triage to room 8 with POX.  SpO2 98-100%, HR 120-135, endorses +DOE.

## 2020-08-12 IMAGING — DX DG CHEST 1V PORT
1 series · 1 of 1 positions shown · non-contrast
Comparison: 9263

CLINICAL DATA: Cough, shortness of breath, COVID positive recently

EXAM:
PORTABLE CHEST 1 VIEW

[chest ap]
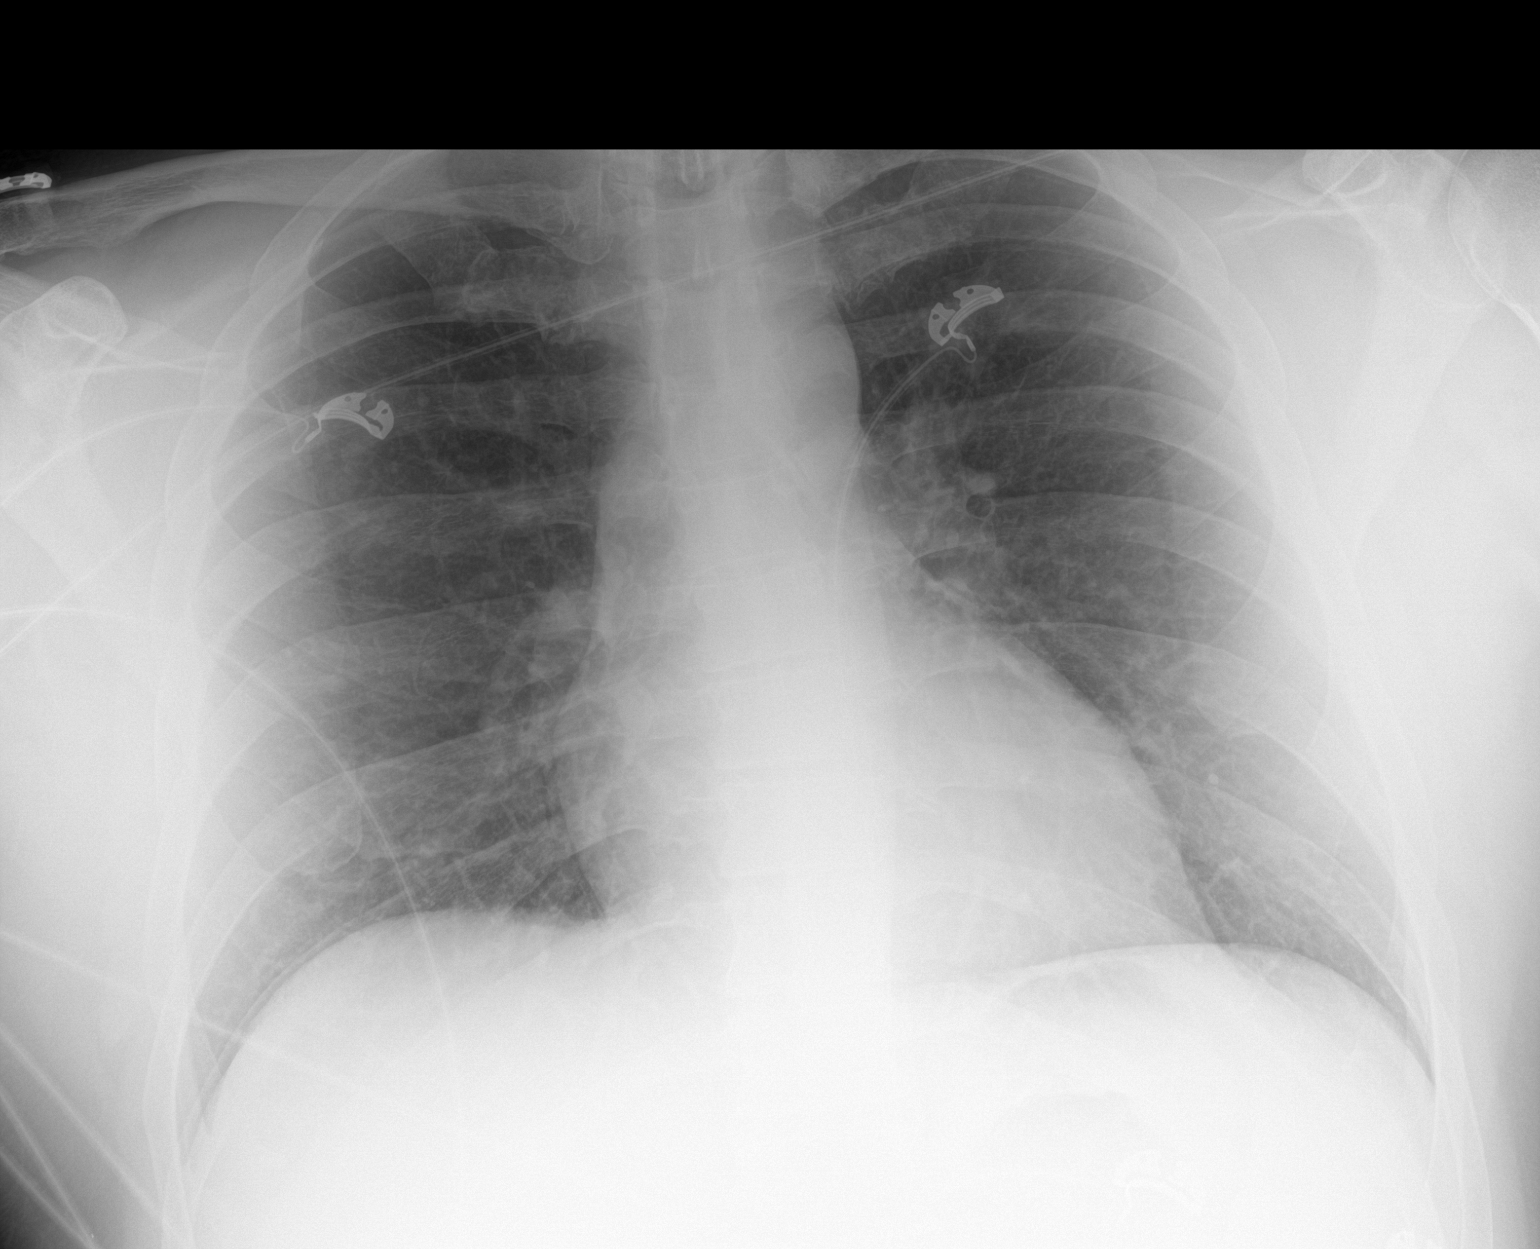

[1 of 1 positions shown; findings below may reference images not displayed]

FINDINGS: The heart size and mediastinal contours are within normal limits.
Both lungs are clear. No pleural effusion or pneumothorax. The
visualized skeletal structures are unremarkable.
IMPRESSION: No acute process in the chest.

## 2020-09-20 ENCOUNTER — Encounter: Payer: Self-pay | Admitting: Internal Medicine

## 2020-11-16 ENCOUNTER — Ambulatory Visit (AMBULATORY_SURGERY_CENTER): Payer: 59

## 2020-11-16 ENCOUNTER — Other Ambulatory Visit: Payer: Self-pay

## 2020-11-16 VITALS — Ht 75.0 in | Wt 290.0 lb

## 2020-11-16 DIAGNOSIS — Z1211 Encounter for screening for malignant neoplasm of colon: Secondary | ICD-10-CM

## 2020-11-16 MED ORDER — SUTAB 1479-225-188 MG PO TABS: 12.0000 | ORAL_TABLET | ORAL | 0 refills | Status: DC

## 2020-11-16 NOTE — Progress Notes (Signed)
Pt verified name, DOB, address and insurance during PV today.   Pt mailed instruction packet to included paper to complete and mail back to [facility] -  with addressed and stamped envelope, Emmi video, copy of consent form to read and not return, and instructions.  [employer] coupon mailed in packet. PV completed over the phone. Pt encouraged to call with questions or issues.  No allergies to soy or egg Pt is not on blood thinners or diet pills   Sedation/intubation not assess has not had surgery only colonoscopy procedure.   Denies atrial flutter/fib Denies constipation   Emmi instructions given to pt  Pt is aware of Covid safety and care partner requirements.  Self report wt:  290 lb as of 3 months ago

## 2020-11-23 ENCOUNTER — Encounter: Payer: Self-pay | Admitting: Internal Medicine

## 2020-11-29 ENCOUNTER — Other Ambulatory Visit: Payer: Self-pay

## 2020-11-29 ENCOUNTER — Ambulatory Visit (AMBULATORY_SURGERY_CENTER): Payer: 59 | Admitting: Internal Medicine

## 2020-11-29 ENCOUNTER — Encounter: Payer: Self-pay | Admitting: Internal Medicine

## 2020-11-29 VITALS — BP 129/88 | HR 64 | Temp 98.5°F | Resp 16 | Ht 75.0 in | Wt 290.0 lb

## 2020-11-29 DIAGNOSIS — Z1211 Encounter for screening for malignant neoplasm of colon: Secondary | ICD-10-CM

## 2020-11-29 MED ORDER — SODIUM CHLORIDE 0.9 % IV SOLN: 500.0000 mL | Freq: Once | INTRAVENOUS | Status: DC

## 2020-11-29 NOTE — Op Note (Signed)
[facility] Patient Name: Catlin Aycock Procedure Date: 11/29/2020 2:32 PM MRN: [id number] Endoscopist: Wilhemina Bonito. Marina Goodell , MD Age: 50 Referring MD:  Date of Birth: 10-23-1971 Gender: Male Account #: [id number] Procedure:                Colonoscopy Indications:              Screening for colorectal malignant neoplasm Medicines:                Monitored Anesthesia Care Procedure:                Pre-Anesthesia Assessment:                           - Prior to the procedure, a History and Physical                            was performed, and patient medications and                            allergies were reviewed. The patient's tolerance of                            previous anesthesia was also reviewed. The risks                            and benefits of the procedure and the sedation                            options and risks were discussed with the patient.                            All questions were answered, and informed consent                            was obtained. Prior Anticoagulants: The patient has                            taken no previous anticoagulant or antiplatelet                            agents. ASA Grade Assessment: II - A patient with                            mild systemic disease. After reviewing the risks                            and benefits, the patient was deemed in                            satisfactory condition to undergo the procedure.                           After obtaining informed consent, the colonoscope  was passed under direct vision. Throughout the                            procedure, the patient's blood pressure, pulse, and                            oxygen saturations were monitored continuously. The                            Olympus CF-HQ190L (Serial# [id number]) Colonoscope was                            introduced through the anus and advanced to the the                            cecum, identified  by appendiceal orifice and                            ileocecal valve. The ileocecal valve, appendiceal                            orifice, and rectum were photographed. The quality                            of the bowel preparation was excellent. The                            colonoscopy was performed without difficulty. The                            patient tolerated the procedure well. The bowel                            preparation used was SUPREP via split dose                            instruction. Scope In: 2:38:49 PM Scope Out: 2:51:04 PM Scope Withdrawal Time: 0 hours 10 minutes 31 seconds  Total Procedure Duration: 0 hours 12 minutes 15 seconds  Findings:                 The entire examined colon appeared normal on direct                            and retroflexion views. Complications:            No immediate complications. Estimated blood loss:                            None. Estimated Blood Loss:     Estimated blood loss: none. Impression:               - The entire examined colon is normal on direct and                            retroflexion  views.                           - No specimens collected. Recommendation:           - Repeat colonoscopy in 10 years for screening                            purposes.                           - Patient has a contact number available for                            emergencies. The signs and symptoms of potential                            delayed complications were discussed with the                            patient. Return to normal activities tomorrow.                            Written discharge instructions were provided to the                            patient.                           - Resume previous diet.                           - Continue present medications. Wilhemina Bonito. Marina Goodell, MD 11/29/2020 3:01:27 PM This report has been signed electronically.

## 2020-11-29 NOTE — Progress Notes (Signed)
PT taken to PACU. Monitors in place. VSS. Report given to RN. 

## 2020-11-29 NOTE — Patient Instructions (Signed)
Repeat colonoscopy in 10 years for screening purposes.  ° °YOU HAD AN ENDOSCOPIC PROCEDURE TODAY AT THE [facility]:   Refer to the procedure report that was given to you for any specific questions about what was found during the examination.  If the procedure report does not answer your questions, please call your gastroenterologist to clarify.  If you requested that your care partner not be given the details of your procedure findings, then the procedure report has been included in a sealed envelope for you to review at your convenience later. ° °YOU SHOULD EXPECT: Some feelings of bloating in the abdomen. Passage of more gas than usual.  Walking can help get rid of the air that was put into your GI tract during the procedure and reduce the bloating. If you had a lower endoscopy (such as a colonoscopy or flexible sigmoidoscopy) you may notice spotting of blood in your stool or on the toilet paper. If you underwent a bowel prep for your procedure, you may not have a normal bowel movement for a few days. ° °Please Note:  You might notice some irritation and congestion in your nose or some drainage.  This is from the oxygen used during your procedure.  There is no need for concern and it should clear up in a day or so. ° °SYMPTOMS TO REPORT IMMEDIATELY: ° °Following lower endoscopy (colonoscopy or flexible sigmoidoscopy): ° Excessive amounts of blood in the stool ° Significant tenderness or worsening of abdominal pains ° Swelling of the abdomen that is new, acute ° Fever of 100°F or higher ° °For urgent or emergent issues, a gastroenterologist can be reached at any hour by calling [phone]. °Do not use MyChart messaging for urgent concerns.  ° ° °DIET:  We do recommend a small meal at first, but then you may proceed to your regular diet.  Drink plenty of fluids but you should avoid alcoholic beverages for 24 hours. ° °ACTIVITY:  You should plan to take it easy for the rest of today and you should  NOT DRIVE or use heavy machinery until tomorrow (because of the sedation medicines used during the test).   ° °FOLLOW UP: °Our staff will call the number listed on your records 48-72 hours following your procedure to check on you and address any questions or concerns that you may have regarding the information given to you following your procedure. If we do not reach you, we will leave a message.  We will attempt to reach you two times.  During this call, we will ask if you have developed any symptoms of COVID 19. If you develop any symptoms (ie: fever, flu-like symptoms, shortness of breath, cough etc.) before then, please call [phone].  If you test positive for Covid 19 in the 2 weeks post procedure, please call and report this information to us.   ° °If any biopsies were taken you will be contacted by phone or by letter within the next 1-3 weeks.  Please call us at [phone] if you have not heard about the biopsies in 3 weeks.  ° ° °SIGNATURES/CONFIDENTIALITY: °You and/or your care partner have signed paperwork which will be entered into your electronic medical record.  These signatures attest to the fact that that the information above on your After Visit Summary has been reviewed and is understood.  Full responsibility of the confidentiality of this discharge information lies with you and/or your care-partner. ° °

## 2020-12-01 ENCOUNTER — Telehealth: Payer: Self-pay

## 2020-12-01 NOTE — Telephone Encounter (Signed)
  Follow up Call-  Call back number 11/29/2020  Post procedure Call Back phone  # [phone]  Permission to leave phone message Yes  Some recent data might be hidden     Patient questions:  Do you have a fever, pain , or abdominal swelling? No. Pain Score  0 *  Have you tolerated food without any problems? Yes.    Have you been able to return to your normal activities? Yes.    Do you have any questions about your discharge instructions: Diet   No. Medications  No. Follow up visit  No.  Do you have questions or concerns about your Care? No.  Actions: * If pain score is 4 or above: No action needed, pain <4.  1. Have you developed a fever since your procedure? No  2.   Have you had an respiratory symptoms (SOB or cough) since your procedure? No  3.   Have you tested positive for COVID 19 since your procedure No  4.   Have you had any family members/close contacts diagnosed with the COVID 19 since your procedure?  No  If yes to any of these questions please route to Laverna Peace, RN and Karlton Lemon, RN
